# Patient Record
Sex: Male | Born: 1990 | Race: White | Hispanic: No | Marital: Single | Smoking: Current some day smoker
Health system: Southern US, Community
[De-identification: ages and names within clinical notes are randomized; demographics above are authoritative.]

---

## 2014-08-04 ENCOUNTER — Emergency Department (HOSPITAL_COMMUNITY): Payer: Self-pay

## 2014-08-04 ENCOUNTER — Encounter (HOSPITAL_COMMUNITY): Payer: Self-pay | Admitting: Emergency Medicine

## 2014-08-04 ENCOUNTER — Emergency Department (HOSPITAL_COMMUNITY)
Admission: EM | Admit: 2014-08-04 | Discharge: 2014-08-04 | Disposition: A | Discharge status: Home / Self Care | Payer: Self-pay | Attending: Emergency Medicine | Admitting: Emergency Medicine

## 2014-08-04 DIAGNOSIS — Z720 Tobacco use: Secondary | ICD-10-CM | POA: Insufficient documentation

## 2014-08-04 DIAGNOSIS — R0789 Other chest pain: Secondary | ICD-10-CM | POA: Insufficient documentation

## 2014-08-04 LAB — BASIC METABOLIC PANEL
Anion gap: 10 (ref 5–15)
BUN: 11 mg/dL (ref 6–20)
CHLORIDE: 103 mmol/L (ref 101–111)
CO2: 23 mmol/L (ref 22–32)
Calcium: 9.2 mg/dL (ref 8.9–10.3)
Creatinine, Ser: 0.86 mg/dL (ref 0.61–1.24)
GFR calc Af Amer: >60 mL/min (ref >60)
GFR calc non Af Amer: >60 mL/min (ref >60)
GLUCOSE: 122 mg/dL — AB (ref 65–99)
Potassium: 3.8 mmol/L (ref 3.5–5.1)
SODIUM: 136 mmol/L (ref 135–145)

## 2014-08-04 LAB — CBC
HCT: 45.3 % (ref 39.0–52.0)
Hemoglobin: 15.1 g/dL (ref 13.0–17.0)
MCH: 28.8 pg (ref 26.0–34.0)
MCHC: 33.3 g/dL (ref 30.0–36.0)
MCV: 86.3 fL (ref 78.0–100.0)
PLATELETS: 276 10*3/uL (ref 150–400)
RBC: 5.25 MIL/uL (ref 4.22–5.81)
RDW: 12.9 % (ref 11.5–15.5)
WBC: 7.7 10*3/uL (ref 4.0–10.5)

## 2014-08-04 LAB — I-STAT TROPONIN, ED: TROPONIN I, POC: 0 ng/mL (ref 0.00–0.08)

## 2014-08-04 MED ORDER — IBUPROFEN 800 MG PO TABS
800.0000 mg | ORAL_TABLET | Freq: Once | ORAL | Status: AC
  Administered 2014-08-04: 800 mg via ORAL
  Filled 2014-08-04: qty 1

## 2014-08-04 MED ORDER — IBUPROFEN 800 MG PO TABS: 800.0000 mg | ORAL_TABLET | Freq: Three times a day (TID) | ORAL | Status: AC

## 2014-08-04 NOTE — Discharge Instructions (Signed)
Chest Wall Pain °Chest wall pain is pain in or around the bones and muscles of your chest. It may take up to 6 weeks to get better. It may take longer if you must stay physically active in your work and activities.  °CAUSES  °Chest wall pain may happen on its own. However, it may be caused by: °· A viral illness like the flu. °· Injury. °· Coughing. °· Exercise. °· Arthritis. °· Fibromyalgia. °· Shingles. °HOME CARE INSTRUCTIONS  °· Avoid overtiring physical activity. Try not to strain or perform activities that cause pain. This includes any activities using your chest or your abdominal and side muscles, especially if heavy weights are used. °· Put ice on the sore area. °¨ Put ice in a plastic bag. °¨ Place a towel between your skin and the bag. °¨ Leave the ice on for 15-20 minutes per hour while awake for the first 2 days. °· Only take over-the-counter or prescription medicines for pain, discomfort, or fever as directed by your caregiver. °SEEK IMMEDIATE MEDICAL CARE IF:  °· Your pain increases, or you are very uncomfortable. °· You have a fever. °· Your chest pain becomes worse. °· You have new, unexplained symptoms. °· You have nausea or vomiting. °· You feel sweaty or lightheaded. °· You have a cough with phlegm (sputum), or you cough up blood. °MAKE SURE YOU:  °· Understand these instructions. °· Will watch your condition. °· Will get help right away if you are not doing well or get worse. °Document Released: 02/25/2005 Document Revised: 05/20/2011 Document Reviewed: 10/22/2010 °ExitCare® Patient Information ©2015 [employer]. This information is not intended to replace advice given to you by your health care provider. Make sure you discuss any questions you have with your health care provider. ° ° °Emergency Department Resource Guide °1) Find a Doctor and Pay Out of Pocket °Although you won't have to find out who is covered by your insurance plan, it is a good idea to ask around and get recommendations. You  will then need to call the office and see if the doctor you have chosen will accept you as a new patient and what types of options they offer for patients who are self-pay. Some doctors offer discounts or will set up payment plans for their patients who do not have insurance, but you will need to ask so you aren't surprised when you get to your appointment. ° °2) Contact Your Local Health Department °Not all health departments have doctors that can see patients for sick visits, but many do, so it is worth a call to see if yours does. If you don't know where your local health department is, you can check in your phone book. The CDC also has a tool to help you locate your state's health department, and many state websites also have listings of all of their local health departments. ° °3) Find a Walk-in Clinic °If your illness is not likely to be very severe or complicated, you may want to try a walk in clinic. These are popping up all over the country in pharmacies, drugstores, and shopping centers. They're usually staffed by nurse practitioners or physician assistants that have been trained to treat common illnesses and complaints. They're usually fairly quick and inexpensive. However, if you have serious medical issues or chronic medical problems, these are probably not your best option. ° °No Primary Care Doctor: °- Call [facility] at  832-8000 - they can help you locate a primary care doctor that    accepts your insurance, provides certain services, etc. °- Physician Referral Service- 1-800-533-3463 ° °Chronic Pain Problems: °Organization         Address  Phone   Notes  °[facility]  (336) 297-2271 Patients need to be referred by their primary care doctor.  ° °Medication Assistance: °Organization         Address  Phone   Notes  °[place] [facility] [address] °[place], [place] [address] (336) 641-8030 --Must be a resident of [place] °-- Must  have NO insurance coverage whatsoever (no Medicaid/ Medicare, etc.) °-- The pt. MUST have a primary care doctor that directs their care regularly and follows them in the community °  °[facility]  (866) 331-1348   °[employer]  (888) 892-1162   ° °Agencies that provide inexpensive medical care: °Organization         Address  Phone   Notes  °[facility]  (336) 832-8035   °[facility]    (336) 832-7272   °[facility] [address] °[place], [place] [address] (336) 832-4777   °[facility] [address], °[place] (336) 271-4999   °[facility]    (336) 373-0678   °[facility]    (336) 272-1050   °[facility] ° [address], [place] Phone:  (336) 832-4444, Fax:  (336) 832-4440 Hours of Operation:  9 am - 6 pm, M-F.  Also accepts Medicaid/Medicare and self-pay.  °[facility] ° [address], [place] Phone: (336) 832-3150, Fax: (336) 832-3151. Hours of Operation:  8:30 am - 5:30 pm, M-F.  Also accepts Medicaid and self-pay.  °[facility] [place] [address], [place] Phone: (336) 878-6027   °[facility] [address], [place], [place] (336)723-1848, Ext. 123 Mondays & Thursdays: 7-9 AM.  First 15 patients are seen on a first come, first serve basis. °  ° °Medicaid-accepting [place] Providers: ° °Organization         Address  Phone   Notes  °[facility] [address], [place] (336) 641-2100 Also accepts self-pay patients.  °[facility] [address], [place] ° (336) 856-9996   °[facility] [address], [place] (336) 288-8857   °[facility] [address] [place] [address], [place] (336) 299-7000   °Veita Bland [address], [place]  ° (336) 373-1557 Only accepts [place] Access Medicaid patients after  they have their name applied to their card.  ° °Self-Pay (no insurance) in [place]: ° °Organization         Address  Phone   Notes  °Sickle Cell Patients, [facility] [address], [place] (336) 832-1970   °[facility] [address], [place] (336) 832-4400   °[facility] ° [address], [place] (336) 992-4800   °[facility]/Dr. Osei-Bonsu ° [address] [place] [address], [place] or [address], [place] (336) 841-8500 Phone number for both [place] and [place] locations is the same.  °[facility] [address], [place] (336) 299-0000   °[facility] [address] [place] [address],  or [address] (336) 852-7530 °(336) 878-2260   °[facility] [address], [place] (336) 350-1642, phone; (336) 294-5005, fax Sees patients 1st and 3rd Saturday of every month.  Must not qualify for public or private insurance (i.e. Medicaid, Medicare, [place] Health Choice, Veterans' Benefits) • Household income should be no more than 200% of the poverty level •The clinic cannot treat you if you are pregnant or think you are pregnant • Sexually transmitted diseases are not treated at the clinic.  ° ° °Dental Care: °Organization         Address  Phone  Notes  °[facility] [address], [place] (336) 641-6152 Accepts children up to age 21 who are enrolled in Medicaid or [place] Health Choice; pregnant women with a Medicaid card; and children who have applied for Medicaid or [place] Health Choice, but were declined, whose parents can pay a reduced fee at time of service.  °[facility] [place]  [address], [place] (336) 641-7733 Accepts children up to age 21 who are enrolled in Medicaid or [place] Health Choice; pregnant women with a Medicaid card; and children who  have applied for Medicaid or [place] Health Choice, but were declined, whose parents can pay a reduced fee at time of service.  °[facility] ° [address], [place] (336) 641-4533 Patients are seen by appointment only. Walk-ins are not accepted. [facility] will see patients 18 years of age and older. °Monday - Tuesday (8am-5pm) °Most Wednesdays (8:30-5pm) °$30 per visit, cash only  °[facility] ° [address], [place] (336) 641-4533 Patients are seen by appointment only. Walk-ins are not accepted. [facility] will see patients 18 years of age and older. °One Wednesday Evening (Monthly: Volunteer Based).  $30 per visit, cash only  °[facility]  (919) 537-3737 for adults; Children under age 4, call Graduate Pediatric Dentistry at (919) [phone]. Children aged 4-14, please call (919) [phone] to request a pediatric application. ° Dental services are provided in all areas of dental care including fillings, crowns and bridges, complete and partial dentures, implants, gum treatment, root canals, and extractions. Preventive care is also provided. Treatment is provided to both adults and children. °Patients are selected via a lottery and there is often a waiting list. °  °[facility] [address], ° ° (336) 763-8833 [email or web address].[email or web address] °  °[facility] [address], [place], [place] (336)723-1848, Ext. 123 Second and Fourth Thursday of each month, opens at 6:30 AM; Clinic ends at 9 AM.  Patients are seen on a first-come first-served basis, and a limited number are seen during each clinic.  ° °[facility] ° [address], [place], [place] (336) 723-7904   Eligibility Requirements °You must have lived in [place], or [place] counties for at least the last three months. °  You cannot be eligible for state or federal sponsored healthcare insurance, including Veterans  Administration, Medicaid, or Medicare. °  You generally cannot be eligible for healthcare insurance through your employer.  °  How to apply: °Eligibility screenings are held every Tuesday and Wednesday afternoon from 1:00 pm until 4:00 pm. You do not need an appointment for the interview!  °[facility] [address], [place], [place] 336-631-2330   °[facility]  336-342-8273   °[facility]  336-703-3100   °[facility]    336-570-6415   ° °Behavioral Health Resources in the Community: °Intensive Outpatient Programs °Organization         Address  Phone  Notes  °[facility] [address], [place], [place] 336-878-6098   °[facility] [address], [place], [place] 336-832-9800   °[facility] [address], [place], [place] ° 336-882-2125   °[facility] [address],  °[place], [place] 1-800-853-5163 or 336-641-4981   °Substance Abuse Resources °Organization         Address  Phone  Notes  °[facility]  336-882-2125   °[facility]  336-784-9470   °[facility]  336-285-9073   °[facility]  336-845-3988   °Residential & Outpatient Substance Abuse Program  1-800-659-3381   °Psychological Services °Organization         Address  Phone  Notes  °[facility]  336- 832-9600   °[facility]  336- 378-7881   °[facility] [address], [place] 1-800-853-5163 or 336-641-4981   ° °Mobile Crisis Teams °Organization         Address  Phone  Notes  °[facility], Mobile Crisis Care Unit  1-877-626-1772   °Assertive °[facility] ° [address] [place], [place] 336-834-9664   °Sharon DeEsch [address] °[place] 336-554-5454   ° °Self-Help/Support Groups °Organization         Address  Phone             Notes  °[facility] -  variety of support groups  336- 373-1402 Call for more information  °Narcotics Anonymous (NA), [facility] [address], °[place]  2 meetings at this location  ° °Residential Treatment Programs °Organization         Address  Phone  Notes  °[facility] [address],    °[place]  1-866-801-8205   °[facility] ° [address], [place], [place] 704-293-8524   °[facility] [address], [place] 336-845-3988 Admissions: 8am-3pm M-F  °[facility] [address]    °[place], [place] 336-841-1104   °[facility] [address], [place], [place] 336-379-7146   °[facility] [address]  °[place], [place] 336-285-9073   °[facility] Intensive Outpatient [address], [place], [place] 336-852-3033   °[facility] [address]  °[place], [place] 1-877-615-2722 or 336-784-9470   °[facility] [address] [place], [place] 336-227-7417 Accepts Medicaid  °[facility] [address]  °[place] 1-800-659-3381 Substance Abuse/Addiction Treatment  ° °[place] Behavioral Health Resources °Organization         Address  Phone  Notes  °[facility]  (888) 581-9988   °Julie Brannon, PhD [address] [place], [place]   (336) 349-5553 or (336) 951-0000   °[facility]   [address] °[place], [place] (336) 349-4454   °[facility] [address], [place], [place] (336) 342-8316 Insurance/Medicaid/sponsorship through [facility]  °[facility] [address]                                    [place], [place] (336) 342-8316 Therapy/tele-psych/case  °[facility] [address]  ° [place], [place] (336) 349-2233    °Dr. Arfeen  (336) 349-4544   °[facility] [place]  [employer] [place] [facility] 1) [address], [place] °2) [address], [place] °3)  [address], [place] (336) 349-3220 °(336) 342-7768 ° °(336) 342-8140   °[place] [facility] (336) 342-1394 or (336) 342-3537 (After Hours)    ° ° ° °

## 2014-08-04 NOTE — ED Provider Notes (Signed)
CSN: [id number]     Arrival date & time 08/04/14  1114 History   First MD Initiated Contact with Patient 08/04/14 1152     Chief Complaint  Patient presents with  . Chest Pain     (Consider location/radiation/quality/duration/timing/severity/associated sxs/prior Treatment) Patient is a 24 y.o. male presenting with chest pain. The history is provided by the patient.  Chest Pain Pain location:  L lateral chest Pain quality: sharp   Pain radiates to:  Does not radiate Pain severity:  Mild Onset quality:  Sudden Duration:  3 days Timing:  Intermittent Progression:  Worsening Chronicity:  New Context: at rest   Relieved by:  Nothing Worsened by:  Nothing tried Associated symptoms: no cough, no fever and no shortness of breath     History reviewed. No pertinent past medical history. History reviewed. No pertinent past surgical history. No family history on file. History  Substance Use Topics  . Smoking status: Current Some Day Smoker  . Smokeless tobacco: Not on file  . Alcohol Use: No     Comment: occasionally    Review of Systems  Constitutional: Negative for fever.  Respiratory: Negative for cough and shortness of breath.   Cardiovascular: Positive for chest pain.  All other systems reviewed and are negative.     Allergies  Review of patient's allergies indicates not on file.  Home Medications   Prior to Admission medications   Not on File   BP 114/53 mmHg  Pulse 76  Temp(Src) 98.3 F (36.8 C) (Oral)  Resp 16  SpO2 97% Physical Exam  Constitutional: He is oriented to person, place, and time. He appears well-developed and well-nourished. No distress.  HENT:  Head: Normocephalic and atraumatic.  Mouth/Throat: No oropharyngeal exudate.  Eyes: EOM are normal. Pupils are equal, round, and reactive to light.  Neck: Normal range of motion. Neck supple.  Cardiovascular: Normal rate and regular rhythm.  Exam reveals no friction rub.   No murmur  heard. Pulmonary/Chest: Effort normal and breath sounds normal. No respiratory distress. He has no wheezes. He has no rales. He exhibits no tenderness.  Abdominal: He exhibits no distension. There is no tenderness. There is no rebound.  Musculoskeletal: Normal range of motion. He exhibits no edema.  Neurological: He is alert and oriented to person, place, and time.  Skin: Skin is warm. No rash noted. He is not diaphoretic.  Nursing note and vitals reviewed.   ED Course  Procedures (including critical care time) Labs Review Labs Reviewed  CBC  BASIC METABOLIC PANEL  Rosezena Sensor, ED    Imaging Review Dg Chest 2 View  08/04/2014   CLINICAL DATA:  Chest pain  EXAM: CHEST  2 VIEW  COMPARISON:  None.  FINDINGS: Lungs are clear. Heart size and pulmonary vascularity are normal. No adenopathy. No bone lesions. No pneumothorax.  IMPRESSION: No edema or consolidation.   Electronically Signed   By: Bretta Bang III M.D.   On: 08/04/2014 12:58     EKG Interpretation   Date/Time:  Thursday Aug 04 2014 11:20:09 EDT Ventricular Rate:  73 PR Interval:  118 QRS Duration: 96 QT Interval:  368 QTC Calculation: 405 R Axis:   104 Text Interpretation:  Normal sinus rhythm Rightward axis Incomplete right  bundle branch block Borderline ECG No prior for comparison Confirmed by  Gwendolyn Grant  MD, Daphnie Venturini ([id number]) on 08/04/2014 11:54:23 AM      MDM   Final diagnoses:  Chest wall pain    26M here with  chest pain. Began 3 days ago while at work, L sided, sharp. No alleviating or exacerbating factors. Lungs clear, no chest tenderness on exam. Likely musculoskeletal. Will xray. Motrin given.  CXR normal. EKG ok. Stable for discharge.  Elwin MochaBlair Brannen Koppen, MD 08/04/14 1538

## 2014-08-04 NOTE — ED Notes (Signed)
Pt states has chest pain started Tuesday while at work-- hurts with expiration. Feels better with inspiration or holding hands over head-- per pt.

## 2016-06-12 IMAGING — CR DG CHEST 2V
2 series · 2 of 2 positions shown · non-contrast
Comparison: None.

CLINICAL DATA: Chest pain

EXAM:
CHEST  2 VIEW

[w chest pa]
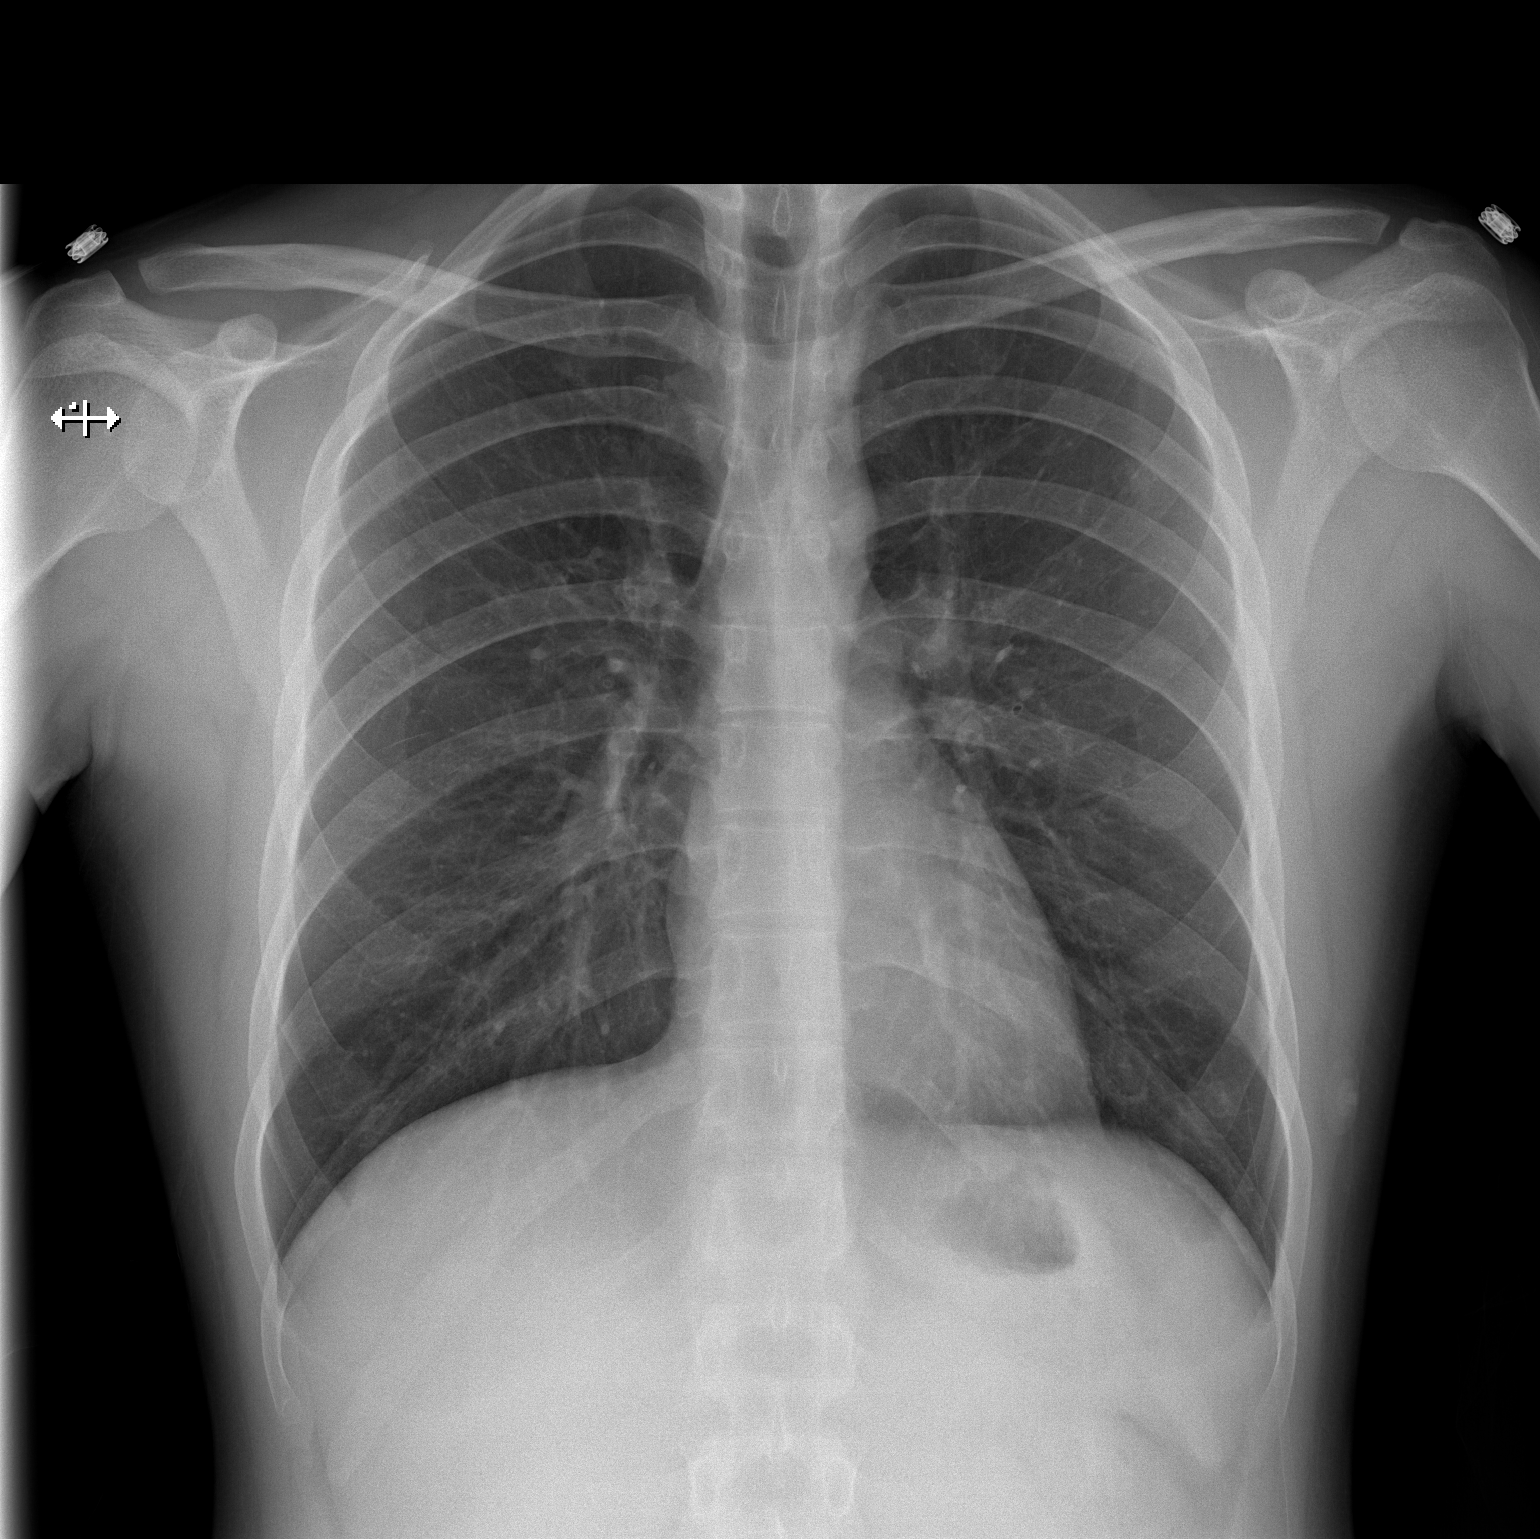

[w chest lat]
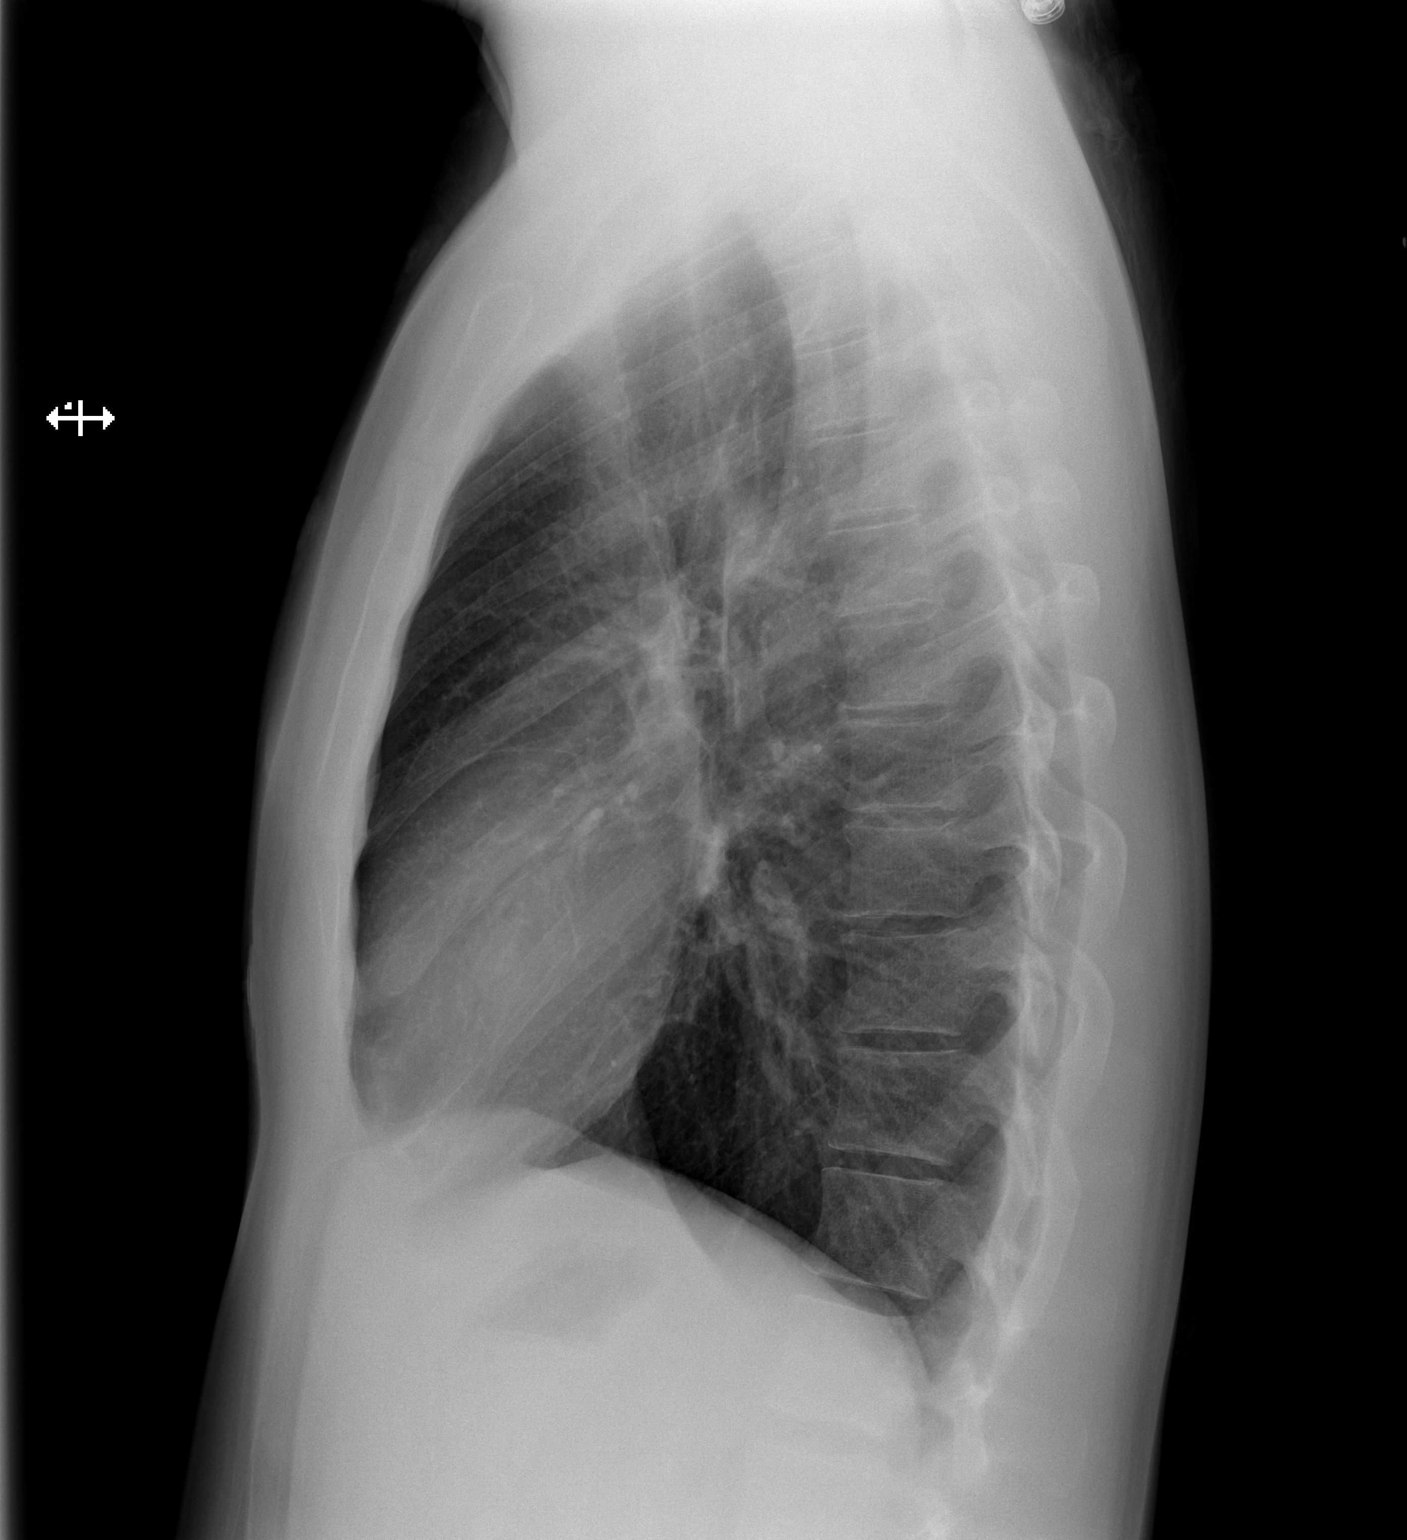

[2 of 2 positions shown; findings below may reference images not displayed]

FINDINGS: Lungs are clear. Heart size and pulmonary vascularity are normal. No
adenopathy. No bone lesions. No pneumothorax.
IMPRESSION: No edema or consolidation.

## 2022-06-28 ENCOUNTER — Other Ambulatory Visit (HOSPITAL_COMMUNITY): Payer: Self-pay | Admitting: Urology

## 2022-06-28 DIAGNOSIS — N1330 Unspecified hydronephrosis: Secondary | ICD-10-CM
